# Patient Record
Sex: Male | Born: 1985 | Race: Black or African American | Hispanic: No | Marital: Single
Health system: Midwestern US, Community
[De-identification: ages and names within clinical notes are randomized; demographics above are authoritative.]

## PROBLEM LIST (undated history)

## (undated) DIAGNOSIS — W3400XA Accidental discharge from unspecified firearms or gun, initial encounter: Secondary | ICD-10-CM

---
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
## 2012-09-18 ENCOUNTER — Encounter: Payer: Self-pay | Admitting: Podiatry

## 2012-10-06 ENCOUNTER — Ambulatory Visit (INDEPENDENT_AMBULATORY_CARE_PROVIDER_SITE_OTHER): Payer: Medicare Other | Admitting: Podiatry

## 2012-10-06 ENCOUNTER — Encounter: Payer: Self-pay | Admitting: Podiatry

## 2012-10-06 VITALS — BP 102/78 | HR 89 | Ht 71.0 in | Wt 165.0 lb

## 2012-10-06 DIAGNOSIS — M25571 Pain in right ankle and joints of right foot: Secondary | ICD-10-CM

## 2012-10-06 DIAGNOSIS — M21961 Unspecified acquired deformity of right lower leg: Secondary | ICD-10-CM

## 2012-10-06 DIAGNOSIS — M21969 Unspecified acquired deformity of unspecified lower leg: Secondary | ICD-10-CM

## 2012-10-06 DIAGNOSIS — M25579 Pain in unspecified ankle and joints of unspecified foot: Secondary | ICD-10-CM

## 2012-10-06 MED ORDER — HYDROCODONE-ACETAMINOPHEN 10-325 MG PO TABS: 1.0000 | ORAL_TABLET | Freq: Four times a day (QID) | ORAL | Status: DC | PRN

## 2012-10-06 NOTE — Progress Notes (Signed)
A:  C/O Right foot pain. Works on feet 10-12 hours/day, 4-5 days/week.  States that antiinflammatory medications do not work. Patient requests for pain pill only.  Patient is wearing well supported high top shoes.  O:  No new change since last visit, pain on right foot after been on long period.   A:  Metatarsus primus elevatus right. Metatarsalgia, lesser right.  P:  Decrease number of hours on feet. Rx. As requested, Hydrocodone 7.5mg .

## 2012-11-07 ENCOUNTER — Ambulatory Visit: Payer: Medicare Other | Admitting: Podiatry

## 2012-11-10 ENCOUNTER — Ambulatory Visit (INDEPENDENT_AMBULATORY_CARE_PROVIDER_SITE_OTHER): Payer: Medicare Other | Admitting: Podiatry

## 2012-11-10 ENCOUNTER — Encounter: Payer: Self-pay | Admitting: Podiatry

## 2012-11-10 VITALS — BP 115/69 | HR 91

## 2012-11-10 DIAGNOSIS — M21969 Unspecified acquired deformity of unspecified lower leg: Secondary | ICD-10-CM

## 2012-11-10 DIAGNOSIS — M25579 Pain in unspecified ankle and joints of unspecified foot: Secondary | ICD-10-CM

## 2012-11-10 DIAGNOSIS — M21961 Unspecified acquired deformity of right lower leg: Secondary | ICD-10-CM

## 2012-11-10 DIAGNOSIS — M25571 Pain in right ankle and joints of right foot: Secondary | ICD-10-CM

## 2012-11-10 MED ORDER — HYDROCODONE-ACETAMINOPHEN 10-325 MG PO TABS: 1.0000 | ORAL_TABLET | Freq: Four times a day (QID) | ORAL | Status: DC | PRN

## 2012-11-10 NOTE — Progress Notes (Signed)
Patient stated that he has the same problem, pain at the first MCJ right duration. Need to have some pain medication so that he can do his work.  Hurts if stand over 6 hours. Patient wants to have it corrected sometime this year.  Works on Health visitor at [employer]. O: Hypermobile first ray with bunion right. No new problems.  A: Metatarsalgia 1st MCJ right with bunion deformity. Hallux abducto valgus right. P: Reviewed findings. Rx. Given.

## 2012-12-03 ENCOUNTER — Encounter: Payer: Self-pay | Admitting: Podiatry

## 2012-12-03 ENCOUNTER — Ambulatory Visit (INDEPENDENT_AMBULATORY_CARE_PROVIDER_SITE_OTHER): Payer: Medicare Other | Admitting: Podiatry

## 2012-12-03 VITALS — BP 115/77 | HR 79

## 2012-12-03 DIAGNOSIS — M25571 Pain in right ankle and joints of right foot: Secondary | ICD-10-CM

## 2012-12-03 DIAGNOSIS — M21961 Unspecified acquired deformity of right lower leg: Secondary | ICD-10-CM

## 2012-12-03 DIAGNOSIS — M25579 Pain in unspecified ankle and joints of unspecified foot: Secondary | ICD-10-CM

## 2012-12-03 DIAGNOSIS — M21969 Unspecified acquired deformity of unspecified lower leg: Secondary | ICD-10-CM

## 2012-12-03 MED ORDER — HYDROCODONE-ACETAMINOPHEN 10-325 MG PO TABS: 1.0000 | ORAL_TABLET | Freq: Four times a day (QID) | ORAL | Status: DC | PRN

## 2012-12-03 NOTE — Progress Notes (Signed)
Subjective: 27 year old male presents complaining of pain and weakness on right foot.  Patient wants Orthotics as advised, but cannot afford neither of them, custom made or over the counter.   Objective: Elevated Metatarsal first right. Pain under the arch of right foot with prolonged weight bearing or walking.  Assessment: Unstable first ray right. Pain right foot plantar fascial band.  Plan: Reviewed the findings. Answered question regarding surgical intervention. Advised to cut down pain medication from 3-4 to 2-3 per day.

## 2013-01-05 ENCOUNTER — Ambulatory Visit (INDEPENDENT_AMBULATORY_CARE_PROVIDER_SITE_OTHER): Payer: Medicare Other | Admitting: Podiatry

## 2013-01-05 DIAGNOSIS — M25579 Pain in unspecified ankle and joints of unspecified foot: Secondary | ICD-10-CM

## 2013-01-05 DIAGNOSIS — M21961 Unspecified acquired deformity of right lower leg: Secondary | ICD-10-CM

## 2013-01-05 DIAGNOSIS — M21969 Unspecified acquired deformity of unspecified lower leg: Secondary | ICD-10-CM

## 2013-01-05 DIAGNOSIS — M25571 Pain in right ankle and joints of right foot: Secondary | ICD-10-CM

## 2013-01-05 MED ORDER — HYDROCODONE-ACETAMINOPHEN 10-325 MG PO TABS: 1.0000 | ORAL_TABLET | Freq: Four times a day (QID) | ORAL | Status: DC | PRN

## 2013-01-05 NOTE — Progress Notes (Signed)
Still taking 3-4 pills a day for right foot pain. Patient points center dorsum of right foot being the source of foot pain.  Patient works on feet 10-15 hours/day.  Objective: Excess sagittal plane motion of right first Metatarsal with dorsal bump at MCJ and Valgus rotated right hallux. Pedal pulses palpable. No acute lesions. No visible edema or erythema noted.  Assessment: Osteoarthropathy first Metatarsocuneiform joint right foot.  Plan: Reviewed clinical findings and available treatment options. Rx. Hydrocodone 10/325 #60 q 4-6 hr prn pain.

## 2013-01-30 ENCOUNTER — Ambulatory Visit (INDEPENDENT_AMBULATORY_CARE_PROVIDER_SITE_OTHER): Payer: Medicare Other | Admitting: Podiatry

## 2013-01-30 DIAGNOSIS — M25579 Pain in unspecified ankle and joints of unspecified foot: Secondary | ICD-10-CM

## 2013-01-30 DIAGNOSIS — M21961 Unspecified acquired deformity of right lower leg: Secondary | ICD-10-CM

## 2013-01-30 DIAGNOSIS — M21969 Unspecified acquired deformity of unspecified lower leg: Secondary | ICD-10-CM

## 2013-01-30 DIAGNOSIS — M25571 Pain in right ankle and joints of right foot: Secondary | ICD-10-CM

## 2013-01-30 MED ORDER — HYDROCODONE-ACETAMINOPHEN 10-325 MG PO TABS: 1.0000 | ORAL_TABLET | Freq: Four times a day (QID) | ORAL | Status: DC | PRN

## 2013-01-30 NOTE — Progress Notes (Signed)
Subjective: Pain over 2nd and 3rd MPJ right. On feet 12 hours/shift. No new problems. Patient wants the medication refilled.  Objective: No visible edema or erythema noted.  No open skin lesions. Pain on foot after been on them for long hours.  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  Plan: Pain management. Continue to wear well supported shoes. Return as needed.

## 2013-03-03 ENCOUNTER — Ambulatory Visit (INDEPENDENT_AMBULATORY_CARE_PROVIDER_SITE_OTHER): Payer: Medicare Other | Admitting: Podiatry

## 2013-03-03 ENCOUNTER — Encounter: Payer: Self-pay | Admitting: Podiatry

## 2013-03-03 ENCOUNTER — Ambulatory Visit: Payer: Medicare Other | Admitting: Podiatry

## 2013-03-03 VITALS — BP 139/97 | HR 73

## 2013-03-03 DIAGNOSIS — M25571 Pain in right ankle and joints of right foot: Secondary | ICD-10-CM

## 2013-03-03 DIAGNOSIS — M21961 Unspecified acquired deformity of right lower leg: Secondary | ICD-10-CM

## 2013-03-03 DIAGNOSIS — M21969 Unspecified acquired deformity of unspecified lower leg: Secondary | ICD-10-CM

## 2013-03-03 DIAGNOSIS — M25579 Pain in unspecified ankle and joints of unspecified foot: Secondary | ICD-10-CM

## 2013-03-03 MED ORDER — HYDROCODONE-ACETAMINOPHEN 10-325 MG PO TABS: 1.0000 | ORAL_TABLET | Freq: Four times a day (QID) | ORAL | Status: DC | PRN

## 2013-03-03 NOTE — Progress Notes (Signed)
Seen for pain on right foot. No new problems.   Objective: Abnormal biomechanics with elevated first ray right. Neurovascular status are within normal. 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 Old scar over the 2nd MPJ right.  Pain under 2nd MPJ right.  Assessment: Metatarsalgia right foot. Elevated first ray right.   Plan: May take pain pills until the foot can be fixed.

## 2013-03-03 NOTE — Patient Instructions (Addendum)
Seen for pain on right foot. Abnormal biomechanics with elevated first ray right. May take pain pills until the foot can be fixed.

## 2013-04-01 ENCOUNTER — Encounter: Payer: Self-pay | Admitting: Podiatry

## 2013-04-01 ENCOUNTER — Ambulatory Visit (INDEPENDENT_AMBULATORY_CARE_PROVIDER_SITE_OTHER): Payer: Medicare Other | Admitting: Podiatry

## 2013-04-01 VITALS — BP 118/72 | HR 74 | Ht 71.0 in | Wt 155.0 lb

## 2013-04-01 DIAGNOSIS — M21969 Unspecified acquired deformity of unspecified lower leg: Secondary | ICD-10-CM

## 2013-04-01 DIAGNOSIS — M21961 Unspecified acquired deformity of right lower leg: Secondary | ICD-10-CM

## 2013-04-01 DIAGNOSIS — M25571 Pain in right ankle and joints of right foot: Secondary | ICD-10-CM

## 2013-04-01 DIAGNOSIS — M25579 Pain in unspecified ankle and joints of unspecified foot: Secondary | ICD-10-CM

## 2013-04-01 MED ORDER — HYDROCODONE-ACETAMINOPHEN 10-325 MG PO TABS: 1.0000 | ORAL_TABLET | Freq: Four times a day (QID) | ORAL | Status: DC | PRN

## 2013-04-01 NOTE — Progress Notes (Signed)
Pain on right foot. Patient wants the medication refill.  Stated that he has no new problems.  Objective:  Abnormal biomechanics with elevated first ray right.  Neurovascular status are within normal.  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  Old scar over the 2nd MPJ right.  Pain under 2nd MPJ right.  Assessment: Metatarsalgia right foot.  Elevated first ray right.  Plan:  May take pain pills until the foot can be fixed.

## 2013-04-01 NOTE — Patient Instructions (Signed)
Reviewed foot pain. May benefit from well supported shoe gear. Take pain pills as needed only.

## 2013-05-04 ENCOUNTER — Encounter: Payer: Self-pay | Admitting: Podiatry

## 2013-05-04 ENCOUNTER — Ambulatory Visit (INDEPENDENT_AMBULATORY_CARE_PROVIDER_SITE_OTHER): Payer: Medicare Other | Admitting: Podiatry

## 2013-05-04 VITALS — BP 111/67 | HR 80 | Ht 71.0 in | Wt 155.0 lb

## 2013-05-04 DIAGNOSIS — M21961 Unspecified acquired deformity of right lower leg: Secondary | ICD-10-CM

## 2013-05-04 DIAGNOSIS — M25579 Pain in unspecified ankle and joints of unspecified foot: Secondary | ICD-10-CM

## 2013-05-04 DIAGNOSIS — M25571 Pain in right ankle and joints of right foot: Secondary | ICD-10-CM

## 2013-05-04 DIAGNOSIS — M21969 Unspecified acquired deformity of unspecified lower leg: Secondary | ICD-10-CM

## 2013-05-04 MED ORDER — HYDROCODONE-ACETAMINOPHEN 10-325 MG PO TABS: 1.0000 | ORAL_TABLET | Freq: Four times a day (QID) | ORAL | Status: DC | PRN

## 2013-05-04 NOTE — Progress Notes (Signed)
Subjective: 27 year old male presents complaining of pain on right foot. Patient wants the medication refill.  Patient points 2nd and 3rd MPJ area of the right foot being the source of foot pain.  Stated that he has no new problems.   Objective:  Abnormal biomechanics with elevated first ray right.  Neurovascular status are within normal.  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  Old scar over the 2nd MPJ right.  Pain under 2nd MPJ right.   Assessment: Metatarsalgia right foot.  Elevated first ray right.  Pain on left foot with long standing and at the end of the work hour.  Plan:  May take pain pills until the foot can be fixed.

## 2013-05-04 NOTE — Patient Instructions (Signed)
Seen for pain on right foot.  Rx. Hydrocodone as per request.  Return as needed.

## 2013-06-03 ENCOUNTER — Ambulatory Visit (INDEPENDENT_AMBULATORY_CARE_PROVIDER_SITE_OTHER): Payer: Medicare Other | Admitting: Podiatry

## 2013-06-03 ENCOUNTER — Encounter: Payer: Self-pay | Admitting: Podiatry

## 2013-06-03 VITALS — BP 126/76 | HR 82

## 2013-06-03 DIAGNOSIS — M25579 Pain in unspecified ankle and joints of unspecified foot: Secondary | ICD-10-CM

## 2013-06-03 DIAGNOSIS — M25571 Pain in right ankle and joints of right foot: Secondary | ICD-10-CM

## 2013-06-03 DIAGNOSIS — M21961 Unspecified acquired deformity of right lower leg: Secondary | ICD-10-CM

## 2013-06-03 DIAGNOSIS — M21969 Unspecified acquired deformity of unspecified lower leg: Secondary | ICD-10-CM

## 2013-06-03 MED ORDER — HYDROCODONE-ACETAMINOPHEN 10-325 MG PO TABS: 1.0000 | ORAL_TABLET | Freq: Four times a day (QID) | ORAL | Status: DC | PRN

## 2013-06-03 NOTE — Progress Notes (Signed)
Subjective:  27 year old male presents complaining of pain on right foot. Patient wants the medication refill.  Patient points 2nd and 3rd MPJ and midfoot area of the right foot being the source of foot pain.  Patient denies having any new problems. Stands on feet 10-12 hours a day at work, 4-5 days/week.  Objective: No change in findings. Abnormal biomechanics with elevated first ray right.  Neurovascular status are within normal.  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  Old scar over the 2nd MPJ right.  Pain under 2nd MPJ right.   Assessment: Metatarsalgia right foot.  Elevated first ray right.  Pain on right foot with prolonged standing and at the end of the work hour.   Plan:  May take pain pills until the foot can be fixed.

## 2013-06-03 NOTE — Patient Instructions (Signed)
Seen for painful right foot.  Prescription renewed. Return as needed.

## 2013-06-26 ENCOUNTER — Encounter: Payer: Self-pay | Admitting: Podiatry

## 2013-06-26 ENCOUNTER — Ambulatory Visit (INDEPENDENT_AMBULATORY_CARE_PROVIDER_SITE_OTHER): Payer: Medicare Other | Admitting: Podiatry

## 2013-06-26 VITALS — BP 117/74 | HR 81

## 2013-06-26 DIAGNOSIS — M21961 Unspecified acquired deformity of right lower leg: Secondary | ICD-10-CM

## 2013-06-26 DIAGNOSIS — M21969 Unspecified acquired deformity of unspecified lower leg: Secondary | ICD-10-CM

## 2013-06-26 DIAGNOSIS — M25579 Pain in unspecified ankle and joints of unspecified foot: Secondary | ICD-10-CM

## 2013-06-26 MED ORDER — HYDROCODONE-ACETAMINOPHEN 10-325 MG PO TABS: 1.0000 | ORAL_TABLET | Freq: Four times a day (QID) | ORAL | Status: DC | PRN

## 2013-06-26 NOTE — Patient Instructions (Signed)
Seen for pain in right foot. Prescription ordered.

## 2013-06-26 NOTE — Progress Notes (Signed)
Subjective:  28 year old male presents complaining of pain on right foot. Patient wants the medication refill.  Objective: No change in findings.  Abnormal biomechanics with elevated first ray right.  Neurovascular status are within normal.  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  Old scar over the 2nd MPJ right.  Pain under 2nd MPJ right.  Assessment: Metatarsalgia right foot.  Elevated first ray right.  Pain on right foot with prolonged standing and at the end of the work hour.  Plan:  May take pain pills until the foot can be fixed.

## 2013-07-05 ENCOUNTER — Encounter (HOSPITAL_COMMUNITY): Payer: Self-pay | Admitting: Emergency Medicine

## 2013-07-05 ENCOUNTER — Emergency Department (HOSPITAL_COMMUNITY)
Admission: EM | Admit: 2013-07-05 | Discharge: 2013-07-05 | Disposition: A | Discharge status: Home / Self Care | Payer: Medicare Other | Attending: Emergency Medicine | Admitting: Emergency Medicine

## 2013-07-05 DIAGNOSIS — K0381 Cracked tooth: Secondary | ICD-10-CM | POA: Insufficient documentation

## 2013-07-05 DIAGNOSIS — F17200 Nicotine dependence, unspecified, uncomplicated: Secondary | ICD-10-CM | POA: Insufficient documentation

## 2013-07-05 DIAGNOSIS — K089 Disorder of teeth and supporting structures, unspecified: Secondary | ICD-10-CM | POA: Insufficient documentation

## 2013-07-05 DIAGNOSIS — K0889 Other specified disorders of teeth and supporting structures: Secondary | ICD-10-CM

## 2013-07-05 MED ORDER — PENICILLIN V POTASSIUM 500 MG PO TABS: 500.0000 mg | ORAL_TABLET | Freq: Four times a day (QID) | ORAL | Status: DC

## 2013-07-05 MED ORDER — HYDROCODONE-ACETAMINOPHEN 5-325 MG PO TABS
2.0000 | ORAL_TABLET | Freq: Once | ORAL | Status: AC
  Administered 2013-07-05: 2 via ORAL
  Filled 2013-07-05: qty 2

## 2013-07-05 MED ORDER — PENICILLIN V POTASSIUM 250 MG PO TABS
500.0000 mg | ORAL_TABLET | Freq: Once | ORAL | Status: AC
  Administered 2013-07-05: 500 mg via ORAL
  Filled 2013-07-05: qty 2

## 2013-07-05 MED ORDER — BENZOCAINE 10 % MT GEL
Freq: Once | OROMUCOSAL | Status: AC
  Administered 2013-07-05: 1 via OROMUCOSAL
  Filled 2013-07-05: qty 9.4

## 2013-07-05 MED ORDER — HYDROCODONE-ACETAMINOPHEN 7.5-325 MG/15ML PO SOLN: 15.0000 mL | Freq: Three times a day (TID) | ORAL | Status: DC | PRN

## 2013-07-05 NOTE — Discharge Instructions (Signed)
Take veetid as directed until gone. Take Vicodin as needed for pain. Refer to attached documents for more information. Return to the ED with worsening or concerning symptoms. Follow up with a dentist from the resource guide for further evaluation.    Emergency Department Resource Guide 1) Find a Doctor and Pay Out of Pocket Although you won't have to find out who is covered by your insurance plan, it is a good idea to ask around and get recommendations. You will then need to call the office and see if the doctor you have chosen will accept you as a new patient and what types of options they offer for patients who are self-pay. Some doctors offer discounts or will set up payment plans for their patients who do not have insurance, but you will need to ask so you aren't surprised when you get to your appointment.  2) Contact Your Local Health Department Not all health departments have doctors that can see patients for sick visits, but many do, so it is worth a call to see if yours does. If you don't know where your local health department is, you can check in your phone book. The CDC also has a tool to help you locate your state's health department, and many state websites also have listings of all of their local health departments.  3) Find a Walk-in Clinic If your illness is not likely to be very severe or complicated, you may want to try a walk in clinic. These are popping up all over the country in pharmacies, drugstores, and shopping centers. They're usually staffed by nurse practitioners or physician assistants that have been trained to treat common illnesses and complaints. They're usually fairly quick and inexpensive. However, if you have serious medical issues or chronic medical problems, these are probably not your best option.  No Primary Care Doctor: - Call Health Connect at  [phone] - they can help you locate a primary care doctor that  accepts your insurance, provides certain services,  etc. - Physician Referral Service- [phone]  Chronic Pain Problems: Organization         Address  Phone   Notes  [facility]  [phone] Patients need to be referred by their primary care doctor.   Medication Assistance: Organization         Address  Phone   Notes  [facility] Medication [facility] [address] [place], [place] [address] [phone] --Must be a resident of [facility] -- Must have NO insurance coverage whatsoever (no Medicaid/ Medicare, etc.) -- The pt. MUST have a primary care doctor that directs their care regularly and follows them in the community   MedAssist  [phone]   [employer]  [phone]    Agencies that provide inexpensive medical care: Organization         Address  Phone   Notes  [facility]  [phone]   [facility]    [phone]   [facility] [address] [place], [place] [address] [phone]   [facility] [address], [place] [phone]   [facility]anned Parenthood    [phone]   [facility]    [phone]   [facility]  [address], [place] Phone:  [phone], Fax:  [phone] Hours of Operation:  9 am - 6 pm, M-F.  Also accepts Medicaid/Medicare  and self-pay.  [facility] for [address] [address], [address], [place] Phone: [phone], Fax: [phone]. Hours of Operation:  8:30 am - 5:30 pm, M-F.  Also accepts Medicaid and self-pay.  [facility] [place] [address], [place] Phone: [phone]   [place], [place], [place] [phone], Ext. [phone] Mondays & Thursdays: 7-9 AM.  First 15 patients are seen on a first come, first serve basis.    [place] Providers:  Organization         Address  Phone   Notes  [facility] [address], [place] [phone] Also accepts self-pay patients.  [facility] [address], [place]  [phone]   [place], [address], [place] [phone]   [facility] [address] [place] [phone]   Lucianne Lei [address], [place]   [phone] Only accepts [place] Access [place] patients after they have their name applied to their card.   Self-Pay (no insurance) in [facility]:  Organization         Address  Phone   Notes  Sickle Cell Patients, [facility] [place] [phone]   [facility] [place] [phone]   [facility] [place]  [place], [place], [place] [phone]   [facility]/Dr. Osei-Bonsu  [address] [place] or [address], [place] [phone] Phone number for both [place] and [place] locations is the same.  [facility] [address] [place] [phone]   [facility] [address] [place] or [address] [phone]   [facility] [place] [address] [place] [phone], phone; [phone], fax Sees patients 1st and 3rd Saturday of every month.  Must not qualify for public or private insurance (i.e. Medicaid, Medicare, [place] Health Choice, Veterans' Benefits)  Household income should be no more than 200% of the poverty level The clinic cannot treat you if you are pregnant or think you are pregnant  Sexually transmitted diseases are not treated at the clinic.    Dental Care: Organization         Address  Phone  Notes  [facility] Department of [facility] [phone] Accepts children up to  age 26 who are enrolled in [place] or [place]; pregnant women with a Medicaid card; and children who have applied for Medicaid or [place] Health Choice, but were declined, whose parents can pay a reduced fee at time of service.  [facility] Department of [facility]  [address], [place] [phone] Accepts children up to age 43 who are enrolled in [place] or [place]; pregnant women with a Medicaid card; and children who have applied for Medicaid or [place] Health Choice, but were declined, whose parents can pay a reduced fee at time of service.  [facility] PRO[facility]  [place] [phone] Patients are seen by appointment only. Walk-ins are not accepted. [place] will see patients 55 years of age and older. Monday - Tuesday (8am-5pm) Most Wednesdays (  8:30-5pm) $30 per visit, cash only  [facility] Adult [facility] Access PROGRAM  [address], [facility] [phone] Patients are seen by appointment only. Walk-ins are not accepted. [place] will see patients 72 years of age and older. One Wednesday Evening (Monthly: Volunteer Based).  $30 per visit, cash only  [place]  [phone] for adults; Children under age 45, call Graduate Pediatric Dentistry at [phone]. Children aged 7-14, please call [phone] to request a pediatric application.  Dental services are provided in all areas of dental care including fillings, crowns and bridges, complete and partial dentures, implants, gum treatment, root canals, and extractions. Preventive care is also provided. Treatment is provided to both adults and children. Patients are selected via a lottery and there is often a waiting list.   [facility] [address], [place]  [phone] [email or web address]   [facility] [address] [place], [place] [phone], Ext. [phone] Second and Fourth Thursday of  each month, opens at 6:30 AM; Clinic ends at 9 AM.  Patients are seen on a first-come first-served basis, and a limited number are seen during each clinic.   [facility]  [address] Hillard Danker [place], [place] [phone]   Eligibility Requirements You must have lived in [place], [place], or [place] counties for at least the last three months.   You cannot be eligible for state or federal sponsored [employer], including [employer], [place], or [employer].   You generally cannot be eligible for healthcare insurance through your employer.    How to apply: Eligibility screenings are held every Tuesday and Wednesday afternoon from 1:00 pm until 4:00 pm. You do not need an appointment for the interview!  [facility] [address], [place], [place]   [place]  [place] Department  [place]  [phone]    Behavioral Health Resources in the Community: Intensive Outpatient Programs Organization         Address  Phone  Notes  [place]. [address], [place], [place] [phone]   [facility] Outpatient [address] [place], [place]   ADS: Alcohol & Drug S[facility]cs [address] [place], [place]   [place] [address],  [place], [place] or [phone]   Substance Abuse Resources Organization         Address  Phone  Notes  Alcohol and Drug Services  [phone]   [place]  [phone]   [facility] [place]   Chinita Pester  [phone]   Residential & Outpatient Substance Abuse Program  [phone]   Psychological Services Organization         Address  Phone  Notes  [facility] [place]  [place]  [phone]   [place] [address],  [place] or [phone]    Mobile Crisis Teams Organization         Address  Phone  Notes  [facility], Mobile Crisis Care Unit  [phone]   [facility] Psychotherapeutic Services  [address] [place], [place]   [place] Levels [address] [place] [phone]    Self-Help/Support Groups Organization         Address  Phone  Notes  [facility] of [place] - variety of support groups  [place] Call for more information  Narcotics Anonymous (NA), [facility]aring [facility] [address], [employer] [place]  2 meetings at this location   Special educational needs teacher         Address  Phone  Notes  ASAP Residential Treatment [place],    [place]  [phone]   [facility]  [address], [place] [phone], [place], [place]   [place], [place] [phone] Admissions: 8am-3pm M-F  Incentives Substance [place] [address],    [place], [place] [phone]   [facility] [address] [place], [place], [place]   [facility] [address]  [place], [place]   [facility] - Intensive Outpatient [address], [place], [place]   [facility] ([place].) [place].,  [place], [place] [phone] or [phone]   [facility] Services (RTS) [address] [place], [place] Accepts Medicaid  [facility] [place] [address],  [place] [phone] Substance Abuse/Addiction Treatment   [facility] Organization         Address  Phone  Notes  [facility]  [phone]   Domenic Schwab, PhD [address] Arlis Porta [place], [place]   [phone] or [phone]   [place], [place] [phone]     [facility] [address], [place], [place] [phone] Insurance/Medicaid/sponsorship through [facility] and Families [address], [address]                                    [place], [place] [phone] [place] [address], [place] [phone]    Dr. Adele Schilder  [phone]   [facility] Dept. 1) [address], [place] 2) [place] 3)  [address], [place] [phone]  [phone]   [place] [phone] or [phone] (After Hours)

## 2013-07-05 NOTE — ED Provider Notes (Signed)
Medical screening examination/treatment/procedure(s) were performed by non-physician practitioner and as supervising physician I was immediately available for consultation/collaboration.  EKG Interpretation   None         Noemy Hallmon, MD 07/05/13 2140 

## 2013-07-05 NOTE — ED Notes (Signed)
To ED via private vehicle with c/o toothache--left lower molar cracked and cavity noted, left side of face swollen
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
## 2013-07-07 ENCOUNTER — Telehealth: Payer: Self-pay | Admitting: *Deleted

## 2013-07-07 NOTE — Telephone Encounter (Signed)
07/07/2013 [facility] [place] saying pt brought a RX in today to be filled and it was too early, Pharmacist said pt is going all over the place using different pharmacies getting narcotics. She wanted us to be aware of the situation.

## 2013-08-04 ENCOUNTER — Ambulatory Visit: Payer: Medicare Other | Admitting: Podiatry

## 2013-08-07 ENCOUNTER — Ambulatory Visit: Payer: Medicare Other | Admitting: Podiatry

## 2013-08-19 ENCOUNTER — Ambulatory Visit (INDEPENDENT_AMBULATORY_CARE_PROVIDER_SITE_OTHER): Payer: Medicare Other | Admitting: Podiatry

## 2013-08-19 ENCOUNTER — Encounter: Payer: Self-pay | Admitting: Podiatry

## 2013-08-19 VITALS — BP 98/63 | HR 80 | Ht 71.0 in | Wt 160.0 lb

## 2013-08-19 DIAGNOSIS — M21961 Unspecified acquired deformity of right lower leg: Secondary | ICD-10-CM

## 2013-08-19 DIAGNOSIS — M25579 Pain in unspecified ankle and joints of unspecified foot: Secondary | ICD-10-CM

## 2013-08-19 DIAGNOSIS — M21969 Unspecified acquired deformity of unspecified lower leg: Secondary | ICD-10-CM

## 2013-08-19 MED ORDER — HYDROCODONE-ACETAMINOPHEN 10-325 MG PO TABS: 1.0000 | ORAL_TABLET | Freq: Four times a day (QID) | ORAL | Status: DC | PRN

## 2013-08-19 NOTE — Patient Instructions (Signed)
Seen for foot pain.  Will benefit from Custom inserts. Use Ace bandage as needed.

## 2013-08-19 NOTE — Progress Notes (Signed)
Subjective:  28 year old male presents complaining of pain on right foot. Patient wants the medication refill.  She was taking extra pain pills due to his toothache. Patient wants to have an Ace bandage to use at work.  Also wants to have Custom shoe inserts.   Objective: Abnormal biomechanics with elevated first ray right.  Neurovascular status are within normal.  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  Old scar over the 2nd MPJ right.  Pain under 2nd MPJ right.   Assessment: Metatarsalgia right foot.  Elevated first ray right.  Pain on right foot with prolonged standing and at the end of the work hour.   Plan:  May use Ace bandage and take pain pills until the foot can be fixed. Will prepare for Custom inserts next visit.

## 2013-09-18 ENCOUNTER — Emergency Department (HOSPITAL_COMMUNITY)
Admission: EM | Admit: 2013-09-18 | Discharge: 2013-09-18 | Disposition: A | Discharge status: Home / Self Care | Payer: Medicare Other | Attending: Emergency Medicine | Admitting: Emergency Medicine

## 2013-09-18 ENCOUNTER — Encounter (HOSPITAL_COMMUNITY): Payer: Self-pay | Admitting: Emergency Medicine

## 2013-09-18 DIAGNOSIS — H6120 Impacted cerumen, unspecified ear: Secondary | ICD-10-CM | POA: Insufficient documentation

## 2013-09-18 DIAGNOSIS — Z792 Long term (current) use of antibiotics: Secondary | ICD-10-CM | POA: Insufficient documentation

## 2013-09-18 DIAGNOSIS — F17200 Nicotine dependence, unspecified, uncomplicated: Secondary | ICD-10-CM | POA: Insufficient documentation

## 2013-09-18 DIAGNOSIS — Z87828 Personal history of other (healed) physical injury and trauma: Secondary | ICD-10-CM | POA: Insufficient documentation

## 2013-09-18 HISTORY — DX: Accidental discharge from unspecified firearms or gun, initial encounter: W34.00XA

## 2013-09-18 MED ORDER — DOCUSATE SODIUM 50 MG/5ML PO LIQD
10.0000 mg | Freq: Once | ORAL | Status: AC
  Administered 2013-09-18: 10 mg via OTIC
  Filled 2013-09-18: qty 10

## 2013-09-18 NOTE — ED Provider Notes (Signed)
Medical screening examination/treatment/procedure(s) were performed by non-physician practitioner and as supervising physician I was immediately available for consultation/collaboration.   EKG Interpretation None        Gwyneth SproutWhitney Malene Blaydes, MD 09/18/13 1320

## 2013-09-18 NOTE — Discharge Instructions (Signed)
Please follow up with your primary care physician in 1-2 days. If you do not have one please call the [facility] number listed above. Please read all discharge instructions and return precautions.    Cerumen Impaction A cerumen impaction is when the wax in your ear forms a plug. This plug usually causes reduced hearing. Sometimes it also causes an earache or dizziness. Removing a cerumen impaction can be difficult and painful. The wax sticks to the ear canal. The canal is sensitive and bleeds easily. If you try to remove a heavy wax buildup with a cotton tipped swab, you may push it in further. Irrigation with water, suction, and small ear curettes may be used to clear out the wax. If the impaction is fixed to the skin in the ear canal, ear drops may be needed for a few days to loosen the wax. People who build up a lot of wax frequently can use ear wax removal products available in your local drugstore. SEEK MEDICAL CARE IF:  You develop an earache, increased hearing loss, or marked dizziness. Document Released: 07/05/2004 Document Revised: 08/20/2011 Document Reviewed: 08/25/2009 [facility] Patient Information 2014 [place].

## 2013-09-18 NOTE — ED Notes (Signed)
Patient states problems with R ear pain and decreased hearing.   Patient states "i've tried everything".   Patient states he is scheduled for oral surgery on Monday and wants this cleared up.
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
## 2013-10-12 ENCOUNTER — Ambulatory Visit (INDEPENDENT_AMBULATORY_CARE_PROVIDER_SITE_OTHER): Payer: Medicare Other | Admitting: Podiatry

## 2013-10-12 ENCOUNTER — Encounter: Payer: Self-pay | Admitting: Podiatry

## 2013-10-12 VITALS — BP 112/67 | HR 109

## 2013-10-12 DIAGNOSIS — M21961 Unspecified acquired deformity of right lower leg: Secondary | ICD-10-CM

## 2013-10-12 DIAGNOSIS — M25579 Pain in unspecified ankle and joints of unspecified foot: Secondary | ICD-10-CM

## 2013-10-12 DIAGNOSIS — M21969 Unspecified acquired deformity of unspecified lower leg: Secondary | ICD-10-CM

## 2013-10-12 MED ORDER — HYDROCODONE-ACETAMINOPHEN 10-325 MG PO TABS: 1.0000 | ORAL_TABLET | Freq: Four times a day (QID) | ORAL | Status: DC | PRN

## 2013-10-12 NOTE — Patient Instructions (Signed)
Seen for pain on left foot. Pain medication refilled.

## 2013-10-12 NOTE — Progress Notes (Signed)
Subjective:  28 year old male presents complaining of pain on right foot. Patient wants the medication refill.   Recently got his teeth fixed.   Objective: Abnormal biomechanics with elevated first ray right.  Neurovascular status are within normal.  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  Old scar over the 2nd MPJ right.  Pain under 2nd MPJ right.   Assessment: Metatarsalgia right foot.  Elevated first ray right.  Pain on right foot with prolonged standing and at the end of the work hour.   Plan:  May use Ace bandage and take pain pills until the foot can be fixed.  Will prepare for Custom inserts next visit.

## 2013-10-30 ENCOUNTER — Encounter: Payer: Self-pay | Admitting: Podiatry

## 2013-10-30 ENCOUNTER — Ambulatory Visit (INDEPENDENT_AMBULATORY_CARE_PROVIDER_SITE_OTHER): Payer: Medicare Other | Admitting: Podiatry

## 2013-10-30 VITALS — BP 129/68 | HR 87

## 2013-10-30 DIAGNOSIS — M21961 Unspecified acquired deformity of right lower leg: Secondary | ICD-10-CM

## 2013-10-30 DIAGNOSIS — M25579 Pain in unspecified ankle and joints of unspecified foot: Secondary | ICD-10-CM

## 2013-10-30 DIAGNOSIS — M21969 Unspecified acquired deformity of unspecified lower leg: Secondary | ICD-10-CM

## 2013-10-30 DIAGNOSIS — M79609 Pain in unspecified limb: Secondary | ICD-10-CM

## 2013-10-30 MED ORDER — OXYCODONE-ACETAMINOPHEN 10-325 MG PO TABS: 1.0000 | ORAL_TABLET | Freq: Three times a day (TID) | ORAL | Status: DC | PRN

## 2013-10-30 NOTE — Patient Instructions (Addendum)
Seen for pain in right foot. Continue with Metatarsal binder. Return as needed.

## 2013-10-30 NOTE — Progress Notes (Signed)
Subjective:  28 year old male presents complaining of pain on right foot. Patient wants the medication refill.  He is wearing Metatarsal binder and is helping him.   Objective: Abnormal biomechanics with elevated first ray right.  Neurovascular status are within normal.  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  Old scar over the 2nd MPJ right.  Pain under 2nd MPJ right.   Assessment: Metatarsalgia right foot.  Elevated first ray right.  Pain on right foot with prolonged standing and at the end of the work hour.   Plan:  Continue with Metatarsal binder.  May benefit from Orthotic inserts.

## 2013-12-09 ENCOUNTER — Ambulatory Visit (INDEPENDENT_AMBULATORY_CARE_PROVIDER_SITE_OTHER): Payer: Medicare Other | Admitting: Podiatry

## 2013-12-09 ENCOUNTER — Encounter: Payer: Self-pay | Admitting: Podiatry

## 2013-12-09 VITALS — BP 115/72 | HR 78

## 2013-12-09 DIAGNOSIS — M25571 Pain in right ankle and joints of right foot: Secondary | ICD-10-CM

## 2013-12-09 DIAGNOSIS — M21961 Unspecified acquired deformity of right lower leg: Secondary | ICD-10-CM

## 2013-12-09 DIAGNOSIS — M25579 Pain in unspecified ankle and joints of unspecified foot: Secondary | ICD-10-CM

## 2013-12-09 DIAGNOSIS — M7750 Other enthesopathy of unspecified foot: Secondary | ICD-10-CM

## 2013-12-09 DIAGNOSIS — M7741 Metatarsalgia, right foot: Secondary | ICD-10-CM

## 2013-12-09 DIAGNOSIS — M21969 Unspecified acquired deformity of unspecified lower leg: Secondary | ICD-10-CM

## 2013-12-09 MED ORDER — OXYCODONE-ACETAMINOPHEN 10-325 MG PO TABS: 1.0000 | ORAL_TABLET | Freq: Two times a day (BID) | ORAL | Status: DC | PRN

## 2013-12-09 NOTE — Progress Notes (Signed)
Subjective:  28 year old male presents complaining of pain on right foot. Patient wants the medication refill.  Patient points mid foot area being painful, no worse or no better, same as usual. Cannot stand on feet no more than 4-5 hours a day.   Objective:  No new changes. Abnormal biomechanics with elevated first ray right.  Neurovascular status are within normal.  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  Old scar over the 2nd MPJ right.  Pain under 2nd MPJ right.   Assessment: Metatarsalgia right foot.  Elevated first ray right.  Pain on right foot with prolonged standing and at the end of the work hour.   Plan:  Continue with Metatarsal binder.  May return in 2 months or as needed.

## 2013-12-09 NOTE — Patient Instructions (Signed)
Seen for pain in right foot. Rx renewed.  Return in 2 months or as needed.

## 2014-01-18 ENCOUNTER — Ambulatory Visit: Payer: Medicare Other | Admitting: Podiatry

## 2014-02-05 ENCOUNTER — Ambulatory Visit: Payer: Medicare Other | Admitting: Podiatry

## 2014-02-08 ENCOUNTER — Encounter: Payer: Self-pay | Admitting: Podiatry

## 2014-02-08 ENCOUNTER — Ambulatory Visit (INDEPENDENT_AMBULATORY_CARE_PROVIDER_SITE_OTHER): Payer: Medicare Other | Admitting: Podiatry

## 2014-02-08 VITALS — BP 117/62 | HR 83

## 2014-02-08 DIAGNOSIS — M21969 Unspecified acquired deformity of unspecified lower leg: Secondary | ICD-10-CM

## 2014-02-08 DIAGNOSIS — M25571 Pain in right ankle and joints of right foot: Secondary | ICD-10-CM

## 2014-02-08 DIAGNOSIS — M21961 Unspecified acquired deformity of right lower leg: Secondary | ICD-10-CM

## 2014-02-08 DIAGNOSIS — M25579 Pain in unspecified ankle and joints of unspecified foot: Secondary | ICD-10-CM

## 2014-02-08 MED ORDER — OXYCODONE-ACETAMINOPHEN 10-325 MG PO TABS: 1.0000 | ORAL_TABLET | Freq: Two times a day (BID) | ORAL | Status: DC | PRN

## 2014-02-08 NOTE — Progress Notes (Signed)
Subjective:  28 year old male presents complaining of pain on right foot. Patient wants the medication refill. He waited till two months are up.  Patient points mid foot area being painful, no worse or no better, same as usual. Cannot stand on feet no more than 4-5 hours a day.   Objective:  No new changes.  Abnormal biomechanics with elevated first ray right.  Neurovascular status are within normal.  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  Old scar over the 2nd MPJ right.  Pain under 2nd MPJ right.   Assessment: Metatarsalgia right foot.  Elevated first ray right.  Pain on right foot with prolonged standing and at the end of the work hour.   Plan:  Continue with Metatarsal binder.  Advised to wear custom orthotics. May return in 2 months or as needed.

## 2014-02-08 NOTE — Patient Instructions (Signed)
Seen for pain in right foot. Medication refilled.

## 2014-04-02 ENCOUNTER — Ambulatory Visit (INDEPENDENT_AMBULATORY_CARE_PROVIDER_SITE_OTHER): Payer: Medicare Other | Admitting: Podiatry

## 2014-04-02 ENCOUNTER — Encounter: Payer: Self-pay | Admitting: Podiatry

## 2014-04-02 VITALS — BP 122/68 | HR 81

## 2014-04-02 DIAGNOSIS — M21961 Unspecified acquired deformity of right lower leg: Secondary | ICD-10-CM

## 2014-04-02 DIAGNOSIS — M25571 Pain in right ankle and joints of right foot: Secondary | ICD-10-CM

## 2014-04-02 MED ORDER — OXYCODONE-ACETAMINOPHEN 10-325 MG PO TABS: 1.0000 | ORAL_TABLET | Freq: Two times a day (BID) | ORAL | Status: DC | PRN

## 2014-04-02 NOTE — Patient Instructions (Signed)
Seen for pain on right foot. Need pain medication in order to do his job.

## 2014-04-02 NOTE — Progress Notes (Signed)
Subjective:  28 year old male presents complaining of pain on right foot. Patient wants the medication refill.  Stated that he need to take at least two pain pills in order to carry through the day. He does not need any pain medication when he is not at work.   Objective:  No new changes.  Abnormal biomechanics with elevated first ray right.  Neurovascular status are within normal.  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  Old scar over the 2nd MPJ right.  Pain under 2nd MPJ right.   Assessment: Metatarsalgia right foot.  Elevated first ray right.  Pain on right foot with prolonged standing and at the end of the work hour.   Plan:  Continue with Metatarsal binder.  Advised to wear custom orthotics.  May return in 2 months or as needed.

## 2014-04-12 ENCOUNTER — Encounter (HOSPITAL_COMMUNITY): Payer: Self-pay | Admitting: *Deleted

## 2014-04-12 ENCOUNTER — Emergency Department (HOSPITAL_COMMUNITY)
Admission: EM | Admit: 2014-04-12 | Discharge: 2014-04-13 | Disposition: A | Discharge status: Home / Self Care | Payer: Medicare Other | Attending: Emergency Medicine | Admitting: Emergency Medicine

## 2014-04-12 DIAGNOSIS — R112 Nausea with vomiting, unspecified: Secondary | ICD-10-CM | POA: Diagnosis present

## 2014-04-12 DIAGNOSIS — Z720 Tobacco use: Secondary | ICD-10-CM | POA: Insufficient documentation

## 2014-04-12 DIAGNOSIS — K529 Noninfective gastroenteritis and colitis, unspecified: Secondary | ICD-10-CM | POA: Diagnosis not present

## 2014-04-12 DIAGNOSIS — Z87828 Personal history of other (healed) physical injury and trauma: Secondary | ICD-10-CM | POA: Insufficient documentation

## 2014-04-12 LAB — COMPREHENSIVE METABOLIC PANEL
ALBUMIN: 3.8 g/dL (ref 3.5–5.2)
ALK PHOS: 55 U/L (ref 39–117)
ALT: 10 U/L (ref 0–53)
AST: 14 U/L (ref 0–37)
Anion gap: 12 (ref 5–15)
BILIRUBIN TOTAL: 0.3 mg/dL (ref 0.3–1.2)
BUN: 6 mg/dL (ref 6–23)
CHLORIDE: 101 meq/L (ref 96–112)
CO2: 26 mEq/L (ref 19–32)
Calcium: 9.2 mg/dL (ref 8.4–10.5)
Creatinine, Ser: 0.75 mg/dL (ref 0.50–1.35)
GFR calc Af Amer: >90 mL/min (ref >90)
GFR calc non Af Amer: >90 mL/min (ref >90)
Glucose, Bld: 125 mg/dL — ABNORMAL HIGH (ref 70–99)
POTASSIUM: 4 meq/L (ref 3.7–5.3)
SODIUM: 139 meq/L (ref 137–147)
Total Protein: 6.9 g/dL (ref 6.0–8.3)

## 2014-04-12 LAB — CBC WITH DIFFERENTIAL/PLATELET
BASOS ABS: 0 10*3/uL (ref 0.0–0.1)
BASOS PCT: 1 % (ref 0–1)
Eosinophils Absolute: 0 10*3/uL (ref 0.0–0.7)
Eosinophils Relative: 0 % (ref 0–5)
HCT: 37 % — ABNORMAL LOW (ref 39.0–52.0)
Hemoglobin: 12.2 g/dL — ABNORMAL LOW (ref 13.0–17.0)
LYMPHS PCT: 26 % (ref 12–46)
Lymphs Abs: 1.3 10*3/uL (ref 0.7–4.0)
MCH: 29.3 pg (ref 26.0–34.0)
MCHC: 33 g/dL (ref 30.0–36.0)
MCV: 88.9 fL (ref 78.0–100.0)
Monocytes Absolute: 0.2 10*3/uL (ref 0.1–1.0)
Monocytes Relative: 5 % (ref 3–12)
NEUTROS PCT: 68 % (ref 43–77)
Neutro Abs: 3.4 10*3/uL (ref 1.7–7.7)
PLATELETS: 193 10*3/uL (ref 150–400)
RBC: 4.16 MIL/uL — ABNORMAL LOW (ref 4.22–5.81)
RDW: 13.8 % (ref 11.5–15.5)
WBC: 5 10*3/uL (ref 4.0–10.5)

## 2014-04-12 LAB — URINALYSIS, ROUTINE W REFLEX MICROSCOPIC
BILIRUBIN URINE: NEGATIVE
GLUCOSE, UA: NEGATIVE mg/dL
Hgb urine dipstick: NEGATIVE
Ketones, ur: NEGATIVE mg/dL
Leukocytes, UA: NEGATIVE
NITRITE: NEGATIVE
PH: 6 (ref 5.0–8.0)
Protein, ur: NEGATIVE mg/dL
SPECIFIC GRAVITY, URINE: 1.013 (ref 1.005–1.030)
Urobilinogen, UA: 0.2 mg/dL (ref 0.0–1.0)

## 2014-04-12 LAB — LIPASE, BLOOD: Lipase: 18 U/L (ref 11–59)

## 2014-04-12 MED ORDER — ONDANSETRON HCL 4 MG/2ML IJ SOLN
4.0000 mg | Freq: Once | INTRAMUSCULAR | Status: AC
  Administered 2014-04-12: 4 mg via INTRAVENOUS
  Filled 2014-04-12: qty 2

## 2014-04-12 MED ORDER — SODIUM CHLORIDE 0.9 % IV BOLUS (SEPSIS)
1000.0000 mL | Freq: Once | INTRAVENOUS | Status: AC
  Administered 2014-04-12: 1000 mL via INTRAVENOUS

## 2014-04-12 NOTE — ED Notes (Signed)
Pt requesting PO fluids.
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
## 2014-04-12 NOTE — ED Notes (Signed)
Pt states that he has had multiple episodes of N/V/D since 2pm today. States that he feels weak and cannot keep food down.

## 2014-04-13 DIAGNOSIS — K529 Noninfective gastroenteritis and colitis, unspecified: Secondary | ICD-10-CM | POA: Diagnosis not present

## 2014-04-13 MED ORDER — PROMETHAZINE HCL 25 MG/ML IJ SOLN
25.0000 mg | Freq: Once | INTRAMUSCULAR | Status: AC
  Administered 2014-04-13: 25 mg via INTRAVENOUS
  Filled 2014-04-13: qty 1

## 2014-04-13 MED ORDER — PROMETHAZINE HCL 25 MG RE SUPP: 25.0000 mg | Freq: Four times a day (QID) | RECTAL | Status: DC | PRN

## 2014-04-13 MED ORDER — PROMETHAZINE HCL 25 MG PO TABS: 25.0000 mg | ORAL_TABLET | Freq: Four times a day (QID) | ORAL | Status: DC | PRN

## 2014-04-13 MED ORDER — ONDANSETRON HCL 4 MG/2ML IJ SOLN
4.0000 mg | Freq: Once | INTRAMUSCULAR | Status: AC
  Administered 2014-04-13: 4 mg via INTRAVENOUS
  Filled 2014-04-13: qty 2

## 2014-04-13 MED ORDER — PROMETHAZINE HCL 25 MG PO TABS
25.0000 mg | ORAL_TABLET | Freq: Once | ORAL | Status: AC
  Administered 2014-04-13: 25 mg via ORAL
  Filled 2014-04-13: qty 1

## 2014-04-13 MED ORDER — SODIUM CHLORIDE 0.9 % IV BOLUS (SEPSIS)
1000.0000 mL | Freq: Once | INTRAVENOUS | Status: AC
  Administered 2014-04-13: 1000 mL via INTRAVENOUS

## 2014-04-13 NOTE — ED Notes (Signed)
Waiting on antiemetic from the pharmacy before discharging patient.

## 2014-04-13 NOTE — Discharge Instructions (Signed)

## 2014-05-03 ENCOUNTER — Encounter: Payer: Self-pay | Admitting: Podiatry

## 2014-05-03 ENCOUNTER — Ambulatory Visit (INDEPENDENT_AMBULATORY_CARE_PROVIDER_SITE_OTHER): Payer: Medicare Other | Admitting: Podiatry

## 2014-05-03 VITALS — BP 128/66 | HR 99

## 2014-05-03 DIAGNOSIS — M25579 Pain in unspecified ankle and joints of unspecified foot: Secondary | ICD-10-CM

## 2014-05-03 DIAGNOSIS — M21961 Unspecified acquired deformity of right lower leg: Secondary | ICD-10-CM

## 2014-05-03 MED ORDER — OXYCODONE-ACETAMINOPHEN 10-325 MG PO TABS: 1.0000 | ORAL_TABLET | Freq: Two times a day (BID) | ORAL | Status: DC | PRN

## 2014-05-03 NOTE — Progress Notes (Signed)
Subjective:  28 year old male presents complaining of pain on right foot. Patient wants the medication refill.  Stated that he cannot work because of pain and need to take 2-3 a day.   Objective:  No new changes.  Abnormal biomechanics with elevated first ray right.  Neurovascular status are within normal.  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  Old scar over the 2nd MPJ right.  Pain under 2nd MPJ right.   Assessment: Metatarsalgia right foot.  Elevated first ray right.  Pain on right foot with prolonged standing and at the end of the work hour.   Plan:  May return in 2 months or as needed.

## 2014-05-03 NOTE — Patient Instructions (Signed)
Seen for painful feet. Rx renewed. May return in 2 months.

## 2014-07-02 ENCOUNTER — Ambulatory Visit: Payer: Medicare Other | Admitting: Podiatry

## 2014-07-05 ENCOUNTER — Ambulatory Visit (INDEPENDENT_AMBULATORY_CARE_PROVIDER_SITE_OTHER): Payer: Medicare Other | Admitting: Podiatry

## 2014-07-05 ENCOUNTER — Encounter: Payer: Self-pay | Admitting: Podiatry

## 2014-07-05 VITALS — BP 120/69 | HR 87

## 2014-07-05 DIAGNOSIS — M25579 Pain in unspecified ankle and joints of unspecified foot: Secondary | ICD-10-CM

## 2014-07-05 DIAGNOSIS — M21961 Unspecified acquired deformity of right lower leg: Secondary | ICD-10-CM

## 2014-07-05 MED ORDER — OXYCODONE-ACETAMINOPHEN 10-325 MG PO TABS: 1.0000 | ORAL_TABLET | Freq: Two times a day (BID) | ORAL | Status: DC | PRN

## 2014-07-05 NOTE — Progress Notes (Signed)
Subjective:  29 year old male presents complaining of pain on right foot.  Start working long hours like 10-12 and feet are aching. Patient wants the medication refill.   Objective:  Abnormal biomechanics with elevated first ray right.  Neurovascular status are within normal.  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  Old scar over the 2nd MPJ right.  Pain under 2nd MPJ right.   Assessment: Metatarsalgia right foot.  Elevated first ray right.  Pain on right foot with prolonged standing and at the end of the work hour.   Plan:  May return in 2 months or as needed.

## 2014-07-05 NOTE — Patient Instructions (Signed)
Seen for pain in right foot after working 10-12 hours on feet. Pain medication refilled as per request.  Return as needed.

## 2014-08-20 ENCOUNTER — Ambulatory Visit (INDEPENDENT_AMBULATORY_CARE_PROVIDER_SITE_OTHER): Payer: Medicare Other | Admitting: Podiatry

## 2014-08-20 ENCOUNTER — Encounter: Payer: Self-pay | Admitting: Podiatry

## 2014-08-20 VITALS — BP 100/71 | HR 80

## 2014-08-20 DIAGNOSIS — M21961 Unspecified acquired deformity of right lower leg: Secondary | ICD-10-CM | POA: Diagnosis not present

## 2014-08-20 DIAGNOSIS — M25571 Pain in right ankle and joints of right foot: Secondary | ICD-10-CM | POA: Diagnosis not present

## 2014-08-20 MED ORDER — OXYCODONE-ACETAMINOPHEN 10-325 MG PO TABS: 1.0000 | ORAL_TABLET | Freq: Two times a day (BID) | ORAL | Status: DC | PRN

## 2014-08-20 NOTE — Progress Notes (Signed)
Patient has right foot pain and wants to have surgery since he was not getting pain medication.  He still on his feet 12 hours a day and feet aches. He wants to have pain medication so that he can continue to be on his feet.    Noted of elevated first ray right foot. Patient has lesser metatarsalgia right. May benefit from Cotton osteotomy with bone graft.  Reviewed problem with narcotics abuse. Patient denies of abuse and stated that he only takes only as needed for long work hour. Patient does not know when he can take off from his new job.  As per request, last prescription given for pain.  Informed patient this is the final prescription.

## 2014-09-10 ENCOUNTER — Emergency Department: Admit: 2014-09-11 | Payer: Self-pay | Primary: Student in an Organized Health Care Education/Training Program

## 2014-09-10 DIAGNOSIS — S9031XA Contusion of right foot, initial encounter: Secondary | ICD-10-CM

## 2014-09-10 NOTE — ED Notes (Signed)
Patient comes in complaining of right foot pain. Patient states that he was helping a friend move and dropped a tv on his right foot.

## 2014-09-10 NOTE — ED Notes (Signed)
11:04 PM  09/10/2014     Discharge instructions given to patient (name) with verbalization of understanding. Patient accompanied by self.  Patient discharged with the following prescriptions Norco, Ibuprofen. Patient discharged to home (destination).      Butch PennyKRISTEN M. GATLING, RN

## 2014-09-11 ENCOUNTER — Inpatient Hospital Stay
Admit: 2014-09-11 | Discharge: 2014-09-11 | Disposition: A | Discharge status: Home / Self Care | Payer: Self-pay | Attending: Emergency Medicine

## 2014-09-11 MED ORDER — IBUPROFEN 400 MG TAB
400 mg | ORAL | Status: AC
Start: 2014-09-11 — End: 2014-09-10
  Administered 2014-09-11: 03:00:00 via ORAL

## 2014-09-11 MED ORDER — HYDROCODONE-ACETAMINOPHEN 5 MG-325 MG TAB
5-325 mg | ORAL_TABLET | ORAL | Status: AC | PRN
Start: 2014-09-11 — End: ?

## 2014-09-11 MED ORDER — IBUPROFEN 800 MG TAB
800 mg | ORAL_TABLET | Freq: Four times a day (QID) | ORAL | Status: AC | PRN
Start: 2014-09-11 — End: 2014-09-17

## 2014-09-11 MED FILL — IBUPROFEN 400 MG TAB: 400 mg | ORAL | Qty: 1
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
## 2014-09-12 NOTE — Other (Shared)
I called to check on pt's condition.  Voice message left.    09/15/2014 - 1550 - I notified this pt of xray results.  He states that he had previous surgery on the injured foot with pins placed.  States his pain is less and he is now walking on his foot better with the cast shoe.  He is going to f/u with his PMD.  Dictation confirmation #

## 2014-09-13 NOTE — Other (Shared)
Multiple small metallic foregin bodies within Left message on voicemail.

## 2014-09-15 NOTE — ED Provider Notes (Signed)
[facility]  EMER[facility] TREATMENT REPORT  NAME:  [name], [name]  SEX:   M  ADMIT: 09/10/2014  DOB:   1986/03/12  MR#    [id number]  ROOM:  [id number]  TIME DICTATED: 03 54 PM  ACCT#  [id number]        ADDENDUM BY Theodis SatoAMMY MILLER, RN:    Today's date  09/15/2014    I notified this patient that his x-rays of the right foot which he done on   09/10/2014 showed multiple tiny metallic fragments in the soft tissue. He   states that he had  previous surgery years ago on the right foot that they had   put pins in his foot and they had to remove them. He states his pain is much   less now and he is able to walk better with a cast shoe. He is planning on   following up with his primary care but states that the swelling has gone down   and the foot is doing better. I did inform him that there were no fractured   bones and he states that he is going to make his followup appointment today.      ___________________  Gwenyth Allegraimothy S Von Quintanar MD  Dictated By: Lenon Ahmadiammy J. Miller, RN    My signature above authenticates this document and my orders, the final  diagnosis (es), discharge prescription (s), and instructions in the [facility]   Pulsecheck record.  Nursing notes have been reviewed by the physician/mid-level provider.    If you have any questions please contact [phone].    [place]  D:09/15/2014 15:54:02  T: 09/15/2014 16:10:49  [id number]

## 2014-09-28 ENCOUNTER — Ambulatory Visit (INDEPENDENT_AMBULATORY_CARE_PROVIDER_SITE_OTHER): Payer: Medicare Other | Admitting: Podiatry

## 2014-09-28 ENCOUNTER — Encounter: Payer: Self-pay | Admitting: Podiatry

## 2014-09-28 VITALS — BP 130/75 | HR 91

## 2014-09-28 DIAGNOSIS — S99921A Unspecified injury of right foot, initial encounter: Secondary | ICD-10-CM | POA: Diagnosis not present

## 2014-09-28 DIAGNOSIS — M79673 Pain in unspecified foot: Secondary | ICD-10-CM | POA: Diagnosis not present

## 2014-09-28 MED ORDER — OXYCODONE-ACETAMINOPHEN 10-325 MG PO TABS: 1.0000 | ORAL_TABLET | Freq: Two times a day (BID) | ORAL | Status: DC | PRN

## 2014-09-28 NOTE — Progress Notes (Signed)
Subjective: 29 year old male presents stating that he has dropped a brick on top of right foot. Been to an ER while he was in out of town. Was told that nothing has broken. Now the foot has a palpable mass over the base of 2nd and 3rd metatarsal bones.  Now works in town.   Objective: Neurovascular status are within normal. Palpable bump over the base of 2nd and 3rd metatarsal shaft with pain upon pressure. Existing condition include: Elevated first ray right foot, Lesser metatarsalgia right.  Assessment: Injury right foot, contusion. Metatarsal deformity with Metatarsalgia. Elevated first ray right.  Plan: Reviewed findings and available options.  Rx. Given as per request.

## 2014-09-28 NOTE — Patient Instructions (Signed)
Seen for pain in right foot following injury. Healing in progress. Rx for pain medication given as per request. Return as needed.

## 2014-11-01 ENCOUNTER — Ambulatory Visit (INDEPENDENT_AMBULATORY_CARE_PROVIDER_SITE_OTHER): Payer: Medicare Other | Admitting: Podiatry

## 2014-11-01 ENCOUNTER — Encounter: Payer: Self-pay | Admitting: Podiatry

## 2014-11-01 VITALS — BP 114/73 | HR 89

## 2014-11-01 DIAGNOSIS — M25571 Pain in right ankle and joints of right foot: Secondary | ICD-10-CM

## 2014-11-01 DIAGNOSIS — M21961 Unspecified acquired deformity of right lower leg: Secondary | ICD-10-CM

## 2014-11-01 MED ORDER — OXYCODONE-ACETAMINOPHEN 10-325 MG PO TABS: 1.0000 | ORAL_TABLET | Freq: Two times a day (BID) | ORAL | Status: DC | PRN

## 2014-11-01 NOTE — Progress Notes (Signed)
Subjective: 29 year old male presents stating that since he has dropped a brick on top of right foot. Been to an ER while he was in out of town.  Was told that nothing has broken. He still having pain at the base of the 2nd and 3rd Metatarsal bone area. Patient requests to be referred to pain management center.   Objective: Neurovascular status are within normal. Palpable bump over the base of 2nd and 3rd metatarsal shaft with pain upon pressure. Existing condition include: Elevated first ray right foot, Lesser metatarsalgia right.  Assessment: Injury right foot, contusion. Metatarsal deformity with Metatarsalgia. Elevated first ray right.  Plan: Reviewed findings and available options, change activity, or reduce weight bearing, NSAIA.  Rx. Given as per request.

## 2014-11-01 NOTE — Patient Instructions (Signed)
Seen for pain in right foot. May reduce pain medication to one a day. Return in 2 months or as needed.

## 2014-12-24 ENCOUNTER — Ambulatory Visit: Payer: Medicare Other | Admitting: Podiatry

## 2015-01-04 ENCOUNTER — Ambulatory Visit (INDEPENDENT_AMBULATORY_CARE_PROVIDER_SITE_OTHER): Payer: Medicare Other | Admitting: Podiatry

## 2015-01-04 ENCOUNTER — Encounter: Payer: Self-pay | Admitting: Podiatry

## 2015-01-04 VITALS — BP 131/70 | HR 74

## 2015-01-04 DIAGNOSIS — M21961 Unspecified acquired deformity of right lower leg: Secondary | ICD-10-CM | POA: Diagnosis not present

## 2015-01-04 DIAGNOSIS — M25571 Pain in right ankle and joints of right foot: Secondary | ICD-10-CM | POA: Diagnosis not present

## 2015-01-04 MED ORDER — OXYCODONE-ACETAMINOPHEN 10-325 MG PO TABS: 1.0000 | ORAL_TABLET | Freq: Two times a day (BID) | ORAL | Status: DC | PRN

## 2015-01-04 NOTE — Patient Instructions (Signed)
Seen for pain in right foot. Rx. For pain given. Return in 2 months.

## 2015-01-04 NOTE — Progress Notes (Signed)
Subjective: 29 year old male presents complaining of right foot pain at mid top as usual.  No new problems.   Objective: Neurovascular status are within normal. Old injured site has healed ok.  Has elevated first ray right foot, Lesser metatarsalgia right.  Assessment: Metatarsal deformity with Metatarsalgia. Elevated first ray right.  Plan: Reviewed findings and available options, change activity, or reduce weight bearing, NSAIA.  Rx. Given as per request.

## 2015-03-07 ENCOUNTER — Ambulatory Visit (INDEPENDENT_AMBULATORY_CARE_PROVIDER_SITE_OTHER): Payer: Medicare Other | Admitting: Podiatry

## 2015-03-07 ENCOUNTER — Encounter: Payer: Self-pay | Admitting: Podiatry

## 2015-03-07 VITALS — BP 111/78 | HR 78

## 2015-03-07 DIAGNOSIS — M25571 Pain in right ankle and joints of right foot: Secondary | ICD-10-CM | POA: Diagnosis not present

## 2015-03-07 DIAGNOSIS — S99921D Unspecified injury of right foot, subsequent encounter: Secondary | ICD-10-CM | POA: Diagnosis not present

## 2015-03-07 MED ORDER — OXYCODONE-ACETAMINOPHEN 10-325 MG PO TABS: 1.0000 | ORAL_TABLET | Freq: Two times a day (BID) | ORAL | Status: DC | PRN

## 2015-03-07 MED ORDER — OXYCODONE-ACETAMINOPHEN 10-325 MG PO TABS
1.0000 | ORAL_TABLET | Freq: Two times a day (BID) | ORAL | Status: DC | PRN
Start: 2015-03-07 — End: 2015-03-07

## 2015-03-07 NOTE — Progress Notes (Signed)
Subjective: 28 year old male presents complaining of right foot pain at mid top as usual. 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 No new problems.   Objective: Neurovascular status are within normal. Old injured site has healed ok.  Has elevated first ray right foot, Lesser metatarsalgia right.  Assessment: Metatarsal deformity with Metatarsalgia. Elevated first ray right. Status post right foot injury.  Plan: Reviewed findings and available options, change activity, or reduce weight bearing, NSAIA.  Rx. Given as per request.  

## 2015-03-07 NOTE — Patient Instructions (Signed)
Seen for pain in right foot. Request pain medication refilled.  Rx. Refilled for Oxycodone. Return in 2 months or as needed.

## 2015-05-03 ENCOUNTER — Encounter: Payer: Self-pay | Admitting: Podiatry

## 2015-05-03 ENCOUNTER — Ambulatory Visit (INDEPENDENT_AMBULATORY_CARE_PROVIDER_SITE_OTHER): Payer: Medicare Other | Admitting: Podiatry

## 2015-05-03 VITALS — BP 133/71 | HR 93

## 2015-05-03 DIAGNOSIS — M21961 Unspecified acquired deformity of right lower leg: Secondary | ICD-10-CM

## 2015-05-03 DIAGNOSIS — M25571 Pain in right ankle and joints of right foot: Secondary | ICD-10-CM | POA: Diagnosis not present

## 2015-05-03 MED ORDER — OXYCODONE-ACETAMINOPHEN 10-325 MG PO TABS: 1.0000 | ORAL_TABLET | Freq: Two times a day (BID) | ORAL | Status: DC | PRN

## 2015-05-03 NOTE — Progress Notes (Signed)
Subjective: 29 year old male presents complaining of right foot pain at mid top as usual. 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 No new problems.   Objective: Neurovascular status are within normal. Old injured site has healed ok.  Has elevated first ray right foot, Lesser metatarsalgia right.  Assessment: Metatarsal deformity with Metatarsalgia. Elevated first ray right. Status post right foot injury.  Plan: Reviewed findings and available options, change activity, or reduce weight bearing, NSAIA.  Rx. Given as per request.

## 2015-05-03 NOTE — Patient Instructions (Signed)
Seen for pain in right foot. Pain medication prescribed. Return as needed. 

## 2015-05-09 ENCOUNTER — Ambulatory Visit: Payer: Medicare Other | Admitting: Podiatry

## 2015-05-31 ENCOUNTER — Encounter: Payer: Self-pay | Admitting: Podiatry

## 2015-05-31 ENCOUNTER — Ambulatory Visit (INDEPENDENT_AMBULATORY_CARE_PROVIDER_SITE_OTHER): Payer: Medicare Other | Admitting: Podiatry

## 2015-05-31 VITALS — BP 110/67 | HR 80

## 2015-05-31 DIAGNOSIS — M21961 Unspecified acquired deformity of right lower leg: Secondary | ICD-10-CM

## 2015-05-31 DIAGNOSIS — M216X9 Other acquired deformities of unspecified foot: Secondary | ICD-10-CM

## 2015-05-31 DIAGNOSIS — M25571 Pain in right ankle and joints of right foot: Secondary | ICD-10-CM

## 2015-05-31 MED ORDER — OXYCODONE-ACETAMINOPHEN 10-325 MG PO TABS: 1.0000 | ORAL_TABLET | Freq: Two times a day (BID) | ORAL | Status: DC | PRN

## 2015-05-31 NOTE — Progress Notes (Signed)
Subjective: 29 year old male presents complaining of right foot pain at mid top as usual.  Working extra hours during holiday season, 15-20 hours a day.  Objective: Neurovascular status are within normal. Old injured site has healed ok.  Has elevated first ray right foot, Lesser metatarsalgia right.  Assessment: Metatarsal deformity with Metatarsalgia. Elevated first ray right. Status post right foot injury.  Plan: Reviewed findings and available options, change activity, or reduce weight bearing, NSAIA.  Rx. Given as per request.

## 2015-05-31 NOTE — Patient Instructions (Signed)
Seen for pain in right foot. Pain medication re ordered as requested.

## 2015-07-05 ENCOUNTER — Ambulatory Visit: Payer: Medicare Other | Admitting: Podiatry

## 2015-07-06 ENCOUNTER — Encounter: Payer: Self-pay | Admitting: Podiatry

## 2015-07-06 ENCOUNTER — Ambulatory Visit (INDEPENDENT_AMBULATORY_CARE_PROVIDER_SITE_OTHER): Payer: Medicare Other | Admitting: Podiatry

## 2015-07-06 VITALS — BP 121/74 | HR 72

## 2015-07-06 DIAGNOSIS — M25571 Pain in right ankle and joints of right foot: Secondary | ICD-10-CM | POA: Diagnosis not present

## 2015-07-06 DIAGNOSIS — M21961 Unspecified acquired deformity of right lower leg: Secondary | ICD-10-CM

## 2015-07-06 MED ORDER — OXYCODONE-ACETAMINOPHEN 10-325 MG PO TABS: 1.0000 | ORAL_TABLET | Freq: Two times a day (BID) | ORAL | Status: DC | PRN

## 2015-07-06 NOTE — Patient Instructions (Signed)
Pain in right foot. Rx prescribed as per request.

## 2015-07-06 NOTE — Progress Notes (Signed)
Subjective: 30 year old male presents complaining of right foot pain at mid top as usual.  Working extra hours and having much foot pain.   Objective: Neurovascular status are within normal. Old injured site has healed ok.  Has elevated first ray right foot, Lesser metatarsalgia right.  Assessment: Metatarsal deformity with Metatarsalgia. Elevated first ray right. Status post right foot injury.  Plan: Reviewed findings and available options, change activity, or reduce weight bearing, NSAIA.  Rx. Given as per request.

## 2015-07-27 ENCOUNTER — Ambulatory Visit (INDEPENDENT_AMBULATORY_CARE_PROVIDER_SITE_OTHER): Payer: Medicare Other | Admitting: Podiatry

## 2015-07-27 ENCOUNTER — Encounter: Payer: Self-pay | Admitting: Podiatry

## 2015-07-27 VITALS — BP 123/65 | HR 77

## 2015-07-27 DIAGNOSIS — M25571 Pain in right ankle and joints of right foot: Secondary | ICD-10-CM | POA: Diagnosis not present

## 2015-07-27 DIAGNOSIS — M7741 Metatarsalgia, right foot: Secondary | ICD-10-CM

## 2015-07-27 DIAGNOSIS — M7740 Metatarsalgia, unspecified foot: Secondary | ICD-10-CM

## 2015-07-27 DIAGNOSIS — S99921D Unspecified injury of right foot, subsequent encounter: Secondary | ICD-10-CM

## 2015-07-27 MED ORDER — OXYCODONE-ACETAMINOPHEN 10-325 MG PO TABS: 1.0000 | ORAL_TABLET | Freq: Two times a day (BID) | ORAL | Status: DC | PRN

## 2015-07-27 NOTE — Patient Instructions (Signed)
Pain medication reordered. Return as needed.

## 2015-07-27 NOTE — Progress Notes (Signed)
Subjective: 30 year old male presents complaining of right foot pain at mid top as usual. Working extra hours and having much foot pain.  Has no new problems.   Objective: Neurovascular status are within normal. Old injured site has healed ok.  Has elevated first ray right foot, Lesser metatarsalgia right.  Assessment: Metatarsal deformity with Metatarsalgia. Elevated first ray right. Status post right foot injury.  Plan: Reviewed findings and available options, change activity, or reduce weight bearing, NSAIA.  Rx. Given as per request.

## 2015-08-02 ENCOUNTER — Ambulatory Visit: Payer: Medicare Other | Admitting: Podiatry

## 2015-09-07 ENCOUNTER — Ambulatory Visit (INDEPENDENT_AMBULATORY_CARE_PROVIDER_SITE_OTHER): Payer: Medicare Other | Admitting: Podiatry

## 2015-09-07 ENCOUNTER — Encounter: Payer: Self-pay | Admitting: Podiatry

## 2015-09-07 VITALS — BP 128/72 | HR 79

## 2015-09-07 DIAGNOSIS — M7741 Metatarsalgia, right foot: Secondary | ICD-10-CM

## 2015-09-07 DIAGNOSIS — S99921A Unspecified injury of right foot, initial encounter: Secondary | ICD-10-CM

## 2015-09-07 MED ORDER — OXYCODONE-ACETAMINOPHEN 10-325 MG PO TABS: 1.0000 | ORAL_TABLET | Freq: Two times a day (BID) | ORAL | Status: DC | PRN

## 2015-09-07 NOTE — Patient Instructions (Signed)
Seen for pain in right with prolonged standing. Pain medication prescribed as requested. Return as needed.

## 2015-09-07 NOTE — Progress Notes (Signed)
Subjective: 30 year old male presents complaining of right foot pain at mid top as usual.  Working on feet 50-60 hours a week at work. Having much foot pain on right to be on feet.  Has no other new problems.   Objective: Neurovascular status are within normal. Has elevated first ray right foot, Lesser metatarsalgia right. 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  Assessment: Metatarsal deformity with Metatarsalgia. Elevated first ray right. Faulty biomechanics right, forefoot varus. Status post right foot injury.  Plan: Reviewed findings and available options, change activity, or reduce weight bearing, NSAIA.  Rx. Given as per request.

## 2015-10-27 ENCOUNTER — Ambulatory Visit (INDEPENDENT_AMBULATORY_CARE_PROVIDER_SITE_OTHER): Payer: Medicare Other | Admitting: Podiatry

## 2015-10-27 ENCOUNTER — Encounter: Payer: Self-pay | Admitting: Podiatry

## 2015-10-27 VITALS — BP 128/70 | HR 68

## 2015-10-27 DIAGNOSIS — M7741 Metatarsalgia, right foot: Secondary | ICD-10-CM

## 2015-10-27 DIAGNOSIS — S99921A Unspecified injury of right foot, initial encounter: Secondary | ICD-10-CM

## 2015-10-27 MED ORDER — OXYCODONE-ACETAMINOPHEN 10-325 MG PO TABS: 1.0000 | ORAL_TABLET | Freq: Two times a day (BID) | ORAL | Status: DC | PRN

## 2015-10-27 NOTE — Progress Notes (Signed)
Subjective: 30 year old male presents complaining of pain at mid top as usual.  Still working long hours on feet. Having much foot pain on right to be on feet. Has no other new problems.   Objective: Neurovascular status are within normal. Has elevated first ray right foot, Lesser metatarsalgia right. 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  Assessment: Metatarsal deformity with Metatarsalgia. Elevated first ray right. Faulty biomechanics right, forefoot varus. Status post right foot injury.  Plan: Reviewed findings and available options, change activity, or reduce weight bearing, NSAIA.  Rx. Given as per request.

## 2015-10-27 NOTE — Patient Instructions (Signed)
Seen for pain in right foot. Pain medication prescribed. 

## 2015-11-08 ENCOUNTER — Ambulatory Visit: Payer: Medicare Other | Admitting: Podiatry

## 2015-11-14 ENCOUNTER — Encounter: Payer: Self-pay | Admitting: Podiatry

## 2015-11-14 ENCOUNTER — Ambulatory Visit (INDEPENDENT_AMBULATORY_CARE_PROVIDER_SITE_OTHER): Payer: Medicare Other | Admitting: Podiatry

## 2015-11-14 DIAGNOSIS — M21961 Unspecified acquired deformity of right lower leg: Secondary | ICD-10-CM

## 2015-11-14 DIAGNOSIS — M7741 Metatarsalgia, right foot: Secondary | ICD-10-CM

## 2015-11-14 MED ORDER — OXYCODONE-ACETAMINOPHEN 10-325 MG PO TABS: 1.0000 | ORAL_TABLET | Freq: Two times a day (BID) | ORAL | Status: DC | PRN

## 2015-11-14 NOTE — Progress Notes (Signed)
Subjective: 30 year old male presents complaining of pain at mid top as usual.  Still working long hours on feet. Having much foot pain on right to be on feet. Has no other new problems.   Objective: Neurovascular status are within normal. Has elevated first ray right foot, Lesser metatarsalgia right. 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  Assessment: Metatarsal deformity with Metatarsalgia. Elevated first ray right. Faulty biomechanics right, forefoot varus. Status post right foot injury.  Plan: Reviewed findings and available options, change activity, or reduce weight bearing, NSAIA.  Rx. Given as per request.

## 2015-11-14 NOTE — Patient Instructions (Signed)
Rx dispensed as per request.

## 2015-12-16 ENCOUNTER — Ambulatory Visit (INDEPENDENT_AMBULATORY_CARE_PROVIDER_SITE_OTHER): Payer: Medicare Other | Admitting: Podiatry

## 2015-12-16 ENCOUNTER — Encounter: Payer: Self-pay | Admitting: Podiatry

## 2015-12-16 DIAGNOSIS — M25571 Pain in right ankle and joints of right foot: Secondary | ICD-10-CM

## 2015-12-16 DIAGNOSIS — S99921D Unspecified injury of right foot, subsequent encounter: Secondary | ICD-10-CM

## 2015-12-16 DIAGNOSIS — M21961 Unspecified acquired deformity of right lower leg: Secondary | ICD-10-CM

## 2015-12-16 MED ORDER — OXYCODONE-ACETAMINOPHEN 10-325 MG PO TABS: 1.0000 | ORAL_TABLET | Freq: Two times a day (BID) | ORAL | Status: DC | PRN

## 2015-12-16 NOTE — Patient Instructions (Signed)
Painful feet with old problem. Rx re ordered. Return as needed.

## 2015-12-16 NOTE — Progress Notes (Signed)
Subjective: 30 year old male presents complaining of pain at mid top where he had previous injury. Still working long hours on feet. Having much foot pain on right to be on feet.   Objective: Neurovascular status are within normal. Has elevated first ray right foot, Lesser metatarsalgia right. 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  Assessment: Metatarsal deformity with Metatarsalgia. Elevated first ray right. Faulty biomechanics right, forefoot varus. Status post right foot injury.  Plan: Reviewed findings and available options, change activity, or reduce weight bearing, NSAIA.  Recommended orthotic shoe inserts. Rx. Given as per request.

## 2016-01-11 ENCOUNTER — Encounter: Payer: Self-pay | Admitting: Podiatry

## 2016-01-11 ENCOUNTER — Ambulatory Visit (INDEPENDENT_AMBULATORY_CARE_PROVIDER_SITE_OTHER): Payer: Medicare Other | Admitting: Podiatry

## 2016-01-11 DIAGNOSIS — M25571 Pain in right ankle and joints of right foot: Secondary | ICD-10-CM

## 2016-01-11 DIAGNOSIS — S99921D Unspecified injury of right foot, subsequent encounter: Secondary | ICD-10-CM | POA: Diagnosis not present

## 2016-01-11 DIAGNOSIS — M21961 Unspecified acquired deformity of right lower leg: Secondary | ICD-10-CM | POA: Diagnosis not present

## 2016-01-11 DIAGNOSIS — M7741 Metatarsalgia, right foot: Secondary | ICD-10-CM | POA: Diagnosis not present

## 2016-01-11 MED ORDER — OXYCODONE-ACETAMINOPHEN 10-325 MG PO TABS: 1.0000 | ORAL_TABLET | Freq: Two times a day (BID) | ORAL | 0 refills | Status: DC | PRN

## 2016-01-11 NOTE — Progress Notes (Signed)
Subjective: 30 year old male presents complaining of pain at mid top where he had previous injury.  Stated that he is trying to cut down on pain medication. Been able to manage one a day for a while. Still working long hours on feet. Having much foot pain on right after been on for long hours.   Objective: Neurovascular status are within normal. Has elevated first ray right foot, Lesser metatarsalgia right. 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  Assessment: Metatarsal deformity with Metatarsalgia. Elevated first ray right. Faulty biomechanics right, forefoot varus. Status post right foot injury.  Plan: Reviewed findings and available options, change activity, or reduce weight bearing, NSAIA.  Recommended orthotic shoe inserts. Rx. Given as per request.

## 2016-01-11 NOTE — Patient Instructions (Signed)
Seen for painful right foot. Rx given as per request.

## 2016-02-16 ENCOUNTER — Other Ambulatory Visit: Payer: Self-pay | Admitting: Podiatry

## 2016-02-16 ENCOUNTER — Telehealth: Payer: Self-pay | Admitting: *Deleted

## 2016-02-16 ENCOUNTER — Ambulatory Visit: Payer: Medicare Other | Admitting: Podiatry

## 2016-02-16 MED ORDER — OXYCODONE-ACETAMINOPHEN 10-325 MG PO TABS: 1.0000 | ORAL_TABLET | Freq: Two times a day (BID) | ORAL | 0 refills | Status: DC | PRN

## 2016-03-19 ENCOUNTER — Ambulatory Visit (INDEPENDENT_AMBULATORY_CARE_PROVIDER_SITE_OTHER): Payer: Medicare Other | Admitting: Podiatry

## 2016-03-19 ENCOUNTER — Encounter: Payer: Self-pay | Admitting: Podiatry

## 2016-03-19 DIAGNOSIS — M7741 Metatarsalgia, right foot: Secondary | ICD-10-CM

## 2016-03-19 DIAGNOSIS — S99921D Unspecified injury of right foot, subsequent encounter: Secondary | ICD-10-CM

## 2016-03-19 MED ORDER — OXYCODONE-ACETAMINOPHEN 10-325 MG PO TABS: 1.0000 | ORAL_TABLET | Freq: Two times a day (BID) | ORAL | 0 refills | Status: DC | PRN

## 2016-03-19 NOTE — Progress Notes (Signed)
Subjective: 30 year old male presents complaining of pain at mid top right foot. Pain is at the previously injured area. Patient wants to check on it with X-ray again when he returns. He does not have time today. Been working at a different job and on Development worker, [employer]. He reported right foot injury in May 2017. Dropped a heavy object on top of the right foot. The findings revealed no acute changes at the time of visit.   Objective: No new findings. Neurovascular status are within normal. No edema or erythema noted. No visible edema or erythema noted. No subjective numbness or tingling other than pain after prolonged hard labor at work.  Orthopedic: Symptomatic lesser Metatarsocuneiform joint area right foot.  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  Assessment: S/P Right foot injury reported May 2017. Metatarsalgia right foot.  Plan: Reviewed findings and available options, change activity, or reduce weight bearing, NSAIA.  Rx. Given as per request.

## 2016-03-19 NOTE — Patient Instructions (Signed)
Seen for pain in right foot. Having more pain on right foot due to extra hard work at a new job. Rx re ordered, Percocet 10/325 one bid prn for pain. Return as needed.

## 2016-04-18 ENCOUNTER — Encounter: Payer: Self-pay | Admitting: Podiatry

## 2016-04-18 ENCOUNTER — Ambulatory Visit (INDEPENDENT_AMBULATORY_CARE_PROVIDER_SITE_OTHER): Payer: Medicare Other | Admitting: Podiatry

## 2016-04-18 VITALS — BP 118/71 | HR 73

## 2016-04-18 DIAGNOSIS — M7741 Metatarsalgia, right foot: Secondary | ICD-10-CM | POA: Diagnosis not present

## 2016-04-18 DIAGNOSIS — M25571 Pain in right ankle and joints of right foot: Secondary | ICD-10-CM

## 2016-04-18 MED ORDER — OXYCODONE-ACETAMINOPHEN 10-325 MG PO TABS: 1.0000 | ORAL_TABLET | Freq: Two times a day (BID) | ORAL | 0 refills | Status: DC | PRN

## 2016-04-18 NOTE — Patient Instructions (Signed)
Seen for pain in right foot. No new problems other than existing right foot pain on top following injury.  As per request, pain medication re ordered.

## 2016-04-18 NOTE — Progress Notes (Signed)
Subjective: 30 year old male presents complaining of pain at mid top right foot. Pain is at the previously injured area.  Been working at a different job and on Development worker, [employer] constant. He reported right foot injury in May 2017. Dropped a heavy object on top of the right foot. The findings revealed no acute changes at the time of visit.   Objective: No new findings. Neurovascular status are within normal. No edema or erythema noted. No visible edema or erythema noted. No subjective numbness or tingling other than pain after prolonged hard labor at work.  Orthopedic: Symptomatic lesser Metatarsocuneiform joint area right foot.  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  Assessment: S/P Right foot injury reported May 2017. Metatarsalgia right foot.  Plan: Reviewed findings and available options, change activity, or reduce weight bearing, NSAIA.  Rx. Given as per request.

## 2016-05-17 ENCOUNTER — Encounter: Payer: Self-pay | Admitting: Podiatry

## 2016-05-17 ENCOUNTER — Ambulatory Visit: Payer: Medicare Other | Admitting: Podiatry

## 2016-05-17 ENCOUNTER — Ambulatory Visit (INDEPENDENT_AMBULATORY_CARE_PROVIDER_SITE_OTHER): Payer: Medicare Other | Admitting: Podiatry

## 2016-05-17 VITALS — BP 115/66 | HR 84

## 2016-05-17 DIAGNOSIS — M7741 Metatarsalgia, right foot: Secondary | ICD-10-CM

## 2016-05-17 MED ORDER — OXYCODONE-ACETAMINOPHEN 10-325 MG PO TABS: 1.0000 | ORAL_TABLET | Freq: Two times a day (BID) | ORAL | 0 refills | Status: AC | PRN

## 2016-05-17 NOTE — Patient Instructions (Signed)
Seen for pain in right foot.  No future pain medication can be prescribed. Rx. Reordered existing pain medication. May return for any treatment for problem foot, nail or skin conditions.

## 2016-05-17 NOTE — Progress Notes (Signed)
Subjective: 30 year old male presents complaining of pain at mid top right foot.  Stated that he just need pain medication to get by.  Objective: No new findings. Neurovascular status are within normal. No edema or erythema noted. No visible edema or erythema noted. No subjective numbness or tingling other than pain after prolonged hard labor at work.  Orthopedic: Symptomatic lesser Metatarsocuneiform joint area right foot.  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  Assessment: S/P Right foot injury reported May 2017. Metatarsalgia right foot.  Plan: Informed no further rx for pain medication can be given after today's visit. Patient accepted.

## 2020-02-10 ENCOUNTER — Emergency Department (HOSPITAL_COMMUNITY)
Admission: EM | Admit: 2020-02-10 | Discharge: 2020-02-11 | Disposition: A | Discharge status: Home / Self Care | Payer: Medicare Other | Attending: Emergency Medicine | Admitting: Emergency Medicine

## 2020-02-10 ENCOUNTER — Encounter (HOSPITAL_COMMUNITY): Payer: Self-pay | Admitting: Emergency Medicine

## 2020-02-10 ENCOUNTER — Emergency Department (HOSPITAL_COMMUNITY): Payer: Medicare Other

## 2020-02-10 DIAGNOSIS — Y9389 Activity, other specified: Secondary | ICD-10-CM | POA: Diagnosis not present

## 2020-02-10 DIAGNOSIS — F10129 Alcohol abuse with intoxication, unspecified: Secondary | ICD-10-CM | POA: Diagnosis not present

## 2020-02-10 DIAGNOSIS — Y999 Unspecified external cause status: Secondary | ICD-10-CM | POA: Diagnosis not present

## 2020-02-10 DIAGNOSIS — S0181XA Laceration without foreign body of other part of head, initial encounter: Secondary | ICD-10-CM | POA: Diagnosis not present

## 2020-02-10 DIAGNOSIS — S3991XA Unspecified injury of abdomen, initial encounter: Secondary | ICD-10-CM | POA: Insufficient documentation

## 2020-02-10 DIAGNOSIS — Y92410 Unspecified street and highway as the place of occurrence of the external cause: Secondary | ICD-10-CM | POA: Diagnosis not present

## 2020-02-10 DIAGNOSIS — S299XXA Unspecified injury of thorax, initial encounter: Secondary | ICD-10-CM | POA: Insufficient documentation

## 2020-02-10 DIAGNOSIS — F10920 Alcohol use, unspecified with intoxication, uncomplicated: Secondary | ICD-10-CM

## 2020-02-10 DIAGNOSIS — S0990XA Unspecified injury of head, initial encounter: Secondary | ICD-10-CM | POA: Diagnosis present

## 2020-02-10 LAB — I-STAT CHEM 8, ED
BUN: 6 mg/dL (ref 6–20)
Calcium, Ion: 0.91 mmol/L — ABNORMAL LOW (ref 1.15–1.40)
Chloride: 103 mmol/L (ref 98–111)
Creatinine, Ser: 1.2 mg/dL (ref 0.61–1.24)
Glucose, Bld: 251 mg/dL — ABNORMAL HIGH (ref 70–99)
HCT: 41 % (ref 39.0–52.0)
Hemoglobin: 13.9 g/dL (ref 13.0–17.0)
Potassium: 3.5 mmol/L (ref 3.5–5.1)
Sodium: 137 mmol/L (ref 135–145)
TCO2: 25 mmol/L (ref 22–32)

## 2020-02-10 LAB — CBC
HCT: 37.2 % — ABNORMAL LOW (ref 39.0–52.0)
Hemoglobin: 12.2 g/dL — ABNORMAL LOW (ref 13.0–17.0)
MCH: 30.5 pg (ref 26.0–34.0)
MCHC: 32.8 g/dL (ref 30.0–36.0)
MCV: 93 fL (ref 80.0–100.0)
Platelets: 191 10*3/uL (ref 150–400)
RBC: 4 MIL/uL — ABNORMAL LOW (ref 4.22–5.81)
RDW: 15.9 % — ABNORMAL HIGH (ref 11.5–15.5)
WBC: 5.9 10*3/uL (ref 4.0–10.5)
nRBC: 0 % (ref 0.0–0.2)

## 2020-02-10 MED ORDER — SODIUM CHLORIDE 0.9 % IV BOLUS
1000.0000 mL | Freq: Once | INTRAVENOUS | Status: AC
  Administered 2020-02-10: 1000 mL via INTRAVENOUS

## 2020-02-10 MED ORDER — SODIUM CHLORIDE 0.9 % IV SOLN: INTRAVENOUS | Status: DC
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
## 2020-02-10 NOTE — Progress Notes (Signed)
Orthopedic Tech Progress Note Patient Details:  Antonio Johnston 02-May-1986 [id number] Level 2 Trauma  Patient ID: Antonio Johnston, male   DOB: May 05, 1986, 34 y.o.   MRN: [id number]   Antonio Johnston 02/10/2020, 11:57 PM

## 2020-02-10 NOTE — Progress Notes (Signed)
Chaplain responded to Level 2. Patient not available.  If family, friends come, please contact chaplain. Rev.Lynnell Chad Pager [phone]
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
## 2020-02-11 ENCOUNTER — Emergency Department (HOSPITAL_COMMUNITY): Payer: Medicare Other

## 2020-02-11 ENCOUNTER — Encounter: Payer: Self-pay | Admitting: Podiatry

## 2020-02-11 DIAGNOSIS — S0181XA Laceration without foreign body of other part of head, initial encounter: Secondary | ICD-10-CM | POA: Diagnosis not present

## 2020-02-11 LAB — COMPREHENSIVE METABOLIC PANEL
ALT: 16 U/L (ref 0–44)
AST: 21 U/L (ref 15–41)
Albumin: 3.8 g/dL (ref 3.5–5.0)
Alkaline Phosphatase: 61 U/L (ref 38–126)
Anion gap: 12 (ref 5–15)
BUN: 6 mg/dL (ref 6–20)
CO2: 24 mmol/L (ref 22–32)
Calcium: 8.8 mg/dL — ABNORMAL LOW (ref 8.9–10.3)
Chloride: 103 mmol/L (ref 98–111)
Creatinine, Ser: 1.13 mg/dL (ref 0.61–1.24)
GFR calc Af Amer: >60 mL/min (ref >60)
GFR calc non Af Amer: >60 mL/min (ref >60)
Glucose, Bld: 261 mg/dL — ABNORMAL HIGH (ref 70–99)
Potassium: 3.3 mmol/L — ABNORMAL LOW (ref 3.5–5.1)
Sodium: 139 mmol/L (ref 135–145)
Total Bilirubin: 0.5 mg/dL (ref 0.3–1.2)
Total Protein: 6.8 g/dL (ref 6.5–8.1)

## 2020-02-11 LAB — ETHANOL: Alcohol, Ethyl (B): 142 mg/dL — ABNORMAL HIGH (ref <10)

## 2020-02-11 LAB — LACTIC ACID, PLASMA: Lactic Acid, Venous: 3.2 mmol/L (ref 0.5–1.9)

## 2020-02-11 MED ORDER — LIDOCAINE-EPINEPHRINE 1 %-1:100000 IJ SOLN: 20.0000 mL | Freq: Once | INTRAMUSCULAR | Status: DC

## 2020-02-11 MED ORDER — TETANUS-DIPHTH-ACELL PERTUSSIS 5-2.5-18.5 LF-MCG/0.5 IM SUSP: 0.5000 mL | Freq: Once | INTRAMUSCULAR | Status: DC

## 2020-02-11 MED ORDER — IOHEXOL 300 MG/ML  SOLN
100.0000 mL | Freq: Once | INTRAMUSCULAR | Status: AC | PRN
  Administered 2020-02-11: 100 mL via INTRAVENOUS

## 2020-02-11 NOTE — ED Notes (Signed)
Patient verbalized understanding of DC instructions, follow up care
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
